# Patient Record
Sex: Female | Born: 1940 | Race: White | Hispanic: No | Marital: Married | Smoking: Never smoker
Health system: Southern US, Community
[De-identification: ages and names within clinical notes are randomized; demographics above are authoritative.]

## PROBLEM LIST (undated history)

## (undated) DIAGNOSIS — K219 Gastro-esophageal reflux disease without esophagitis: Secondary | ICD-10-CM

## (undated) DIAGNOSIS — E039 Hypothyroidism, unspecified: Secondary | ICD-10-CM

## (undated) DIAGNOSIS — I10 Essential (primary) hypertension: Secondary | ICD-10-CM

## (undated) DIAGNOSIS — E7800 Pure hypercholesterolemia, unspecified: Secondary | ICD-10-CM

## (undated) HISTORY — PX: OTHER SURGICAL HISTORY: SHX169

## (undated) HISTORY — DX: Hypothyroidism, unspecified: E03.9

## (undated) HISTORY — DX: Gastro-esophageal reflux disease without esophagitis: K21.9

## (undated) HISTORY — PX: PARTIAL HYSTERECTOMY: SHX80

## (undated) HISTORY — DX: Essential (primary) hypertension: I10

## (undated) HISTORY — DX: Pure hypercholesterolemia, unspecified: E78.00

## (undated) HISTORY — PX: CHOLECYSTECTOMY: SHX55

---

## 2018-04-22 ENCOUNTER — Encounter (INDEPENDENT_AMBULATORY_CARE_PROVIDER_SITE_OTHER): Payer: Self-pay | Admitting: *Deleted

## 2018-04-22 ENCOUNTER — Encounter (INDEPENDENT_AMBULATORY_CARE_PROVIDER_SITE_OTHER): Payer: Self-pay | Admitting: Internal Medicine

## 2018-04-22 ENCOUNTER — Ambulatory Visit (INDEPENDENT_AMBULATORY_CARE_PROVIDER_SITE_OTHER): Payer: Medicare Other | Admitting: Internal Medicine

## 2018-04-22 VITALS — BP 150/80 | HR 56 | Temp 97.3°F | Ht 60.0 in | Wt 151.7 lb

## 2018-04-22 DIAGNOSIS — K219 Gastro-esophageal reflux disease without esophagitis: Secondary | ICD-10-CM | POA: Insufficient documentation

## 2018-04-22 DIAGNOSIS — E7800 Pure hypercholesterolemia, unspecified: Secondary | ICD-10-CM

## 2018-04-22 DIAGNOSIS — R1030 Lower abdominal pain, unspecified: Secondary | ICD-10-CM

## 2018-04-22 DIAGNOSIS — E039 Hypothyroidism, unspecified: Secondary | ICD-10-CM

## 2018-04-22 DIAGNOSIS — R1033 Periumbilical pain: Secondary | ICD-10-CM | POA: Diagnosis not present

## 2018-04-22 DIAGNOSIS — R195 Other fecal abnormalities: Secondary | ICD-10-CM

## 2018-04-22 DIAGNOSIS — I10 Essential (primary) hypertension: Secondary | ICD-10-CM

## 2018-04-22 HISTORY — DX: Pure hypercholesterolemia, unspecified: E78.00

## 2018-04-22 HISTORY — DX: Gastro-esophageal reflux disease without esophagitis: K21.9

## 2018-04-22 HISTORY — DX: Hypothyroidism, unspecified: E03.9

## 2018-04-22 HISTORY — DX: Essential (primary) hypertension: I10

## 2018-04-22 NOTE — Progress Notes (Addendum)
Subjective:    Patient ID: Samantha Morrow, female    DOB: 15-Apr-1941, 77 y.o.   MRN: [id number]  HPI Referred by Dr. Loni Dolly for Irritable bowel syndrome. After she had gallbladder surgery in 2003, she noticed a change in her stools. She would have to go to the bathroom after eating to have a BM.  Symptoms lasted for about a year and a half. In February of this year, she had abdominal pain which radiated into her back. The pain lasted about a week. She saw her PCP a week later and urine was negative. She says now when she has a BM, she has stool shaped like a pebble. Her second stool of the day will be normal.  She says her PCP started her on Miralax and she takes every 3 days. Since starting the Miralx her stools are better.   Stools are formed and are firm. If she has a second stool, it will be softer.  Sometimes she will have lower abdominal pain just before she has a BM.  Her last colonoscopy was in 2012 by Dr. Aleene Davidson and she reports it was normal. She says she is 80% better. She tells me she has a hemorrhoid.  She also says she has a rectocele  She has 2 stools a day.  Appetite is good. No weight loss.   12/14/2010 Colonoscopy: screening: Dr. Aleene Davidson. Normal. Mild internal hemorrhoids, Cecum visualized.Rectosigmoid, descending, transverse, ascending colon and cecum without polypoid disease.   Review of Systems   Past Medical History:  Diagnosis Date  . Essential hypertension 04/22/2018  . GERD (gastroesophageal reflux disease) 04/22/2018  . High cholesterol 04/22/2018  . Hypothyroidism 04/22/2018      Allergies  Allergen Reactions  . Aspirin     Upset stomach  . Erythromycin     ? Throat swelled  . Macrobid [Nitrofurantoin Macrocrystal]     unknown  . Penicillins     Mouth swells, rash  . Quinolones     Unknown     Current Outpatient Medications on File Prior to Visit  Medication Sig Dispense Refill  . Cholecalciferol (VITAMIN D3) 2000 units TABS Take 3,000 Units  by mouth daily.    . cyanocobalamin 1000 MCG tablet Take 1,000 mcg by mouth daily.    Marland Kitchen ezetimibe (ZETIA) 10 MG tablet Take 10 mg by mouth daily.    Marland Kitchen labetalol (NORMODYNE) 200 MG tablet Take 200 mg by mouth 2 (two) times daily.    Marland Kitchen levothyroxine (SYNTHROID, LEVOTHROID) 25 MCG tablet Take 25 mcg by mouth daily before breakfast.    . ranitidine (ZANTAC) 150 MG tablet Take 150 mg by mouth 2 (two) times daily.    Marland Kitchen triamterene-hydrochlorothiazide (MAXZIDE) 75-50 MG tablet Take 1 tablet by mouth daily.     No current facility-administered medications on file prior to visit.         Objective:   Physical Exam Blood pressure (!) 150/80, pulse (!) 56, temperature (!) 97.3 F (36.3 C), height 5' (1.524 m), weight 151 lb 11.2 oz (68.8 kg).  Alert and oriented. Skin warm and dry. Oral mucosa is moist.   . Sclera anicteric, conjunctivae is pink. Thyroid not enlarged. No cervical lymphadenopathy. Lungs clear. Heart regular rate and rhythm.  Abdomen is soft. Bowel sounds are positive. No hepatomegaly. No abdominal masses felt. No tenderness.  No edema to lower extremities. Rectal exam no masses, guaiac negative.  ? Rectocele noted bulging from her vagina.        Assessment &  Plan:  Change in stool/abdominal pain . Stool are better now since starting the Miralax. Will get last colonoscopy report from Dr. Aleene Davidson.  Am going to get an [place] for the abdominal pain.  Further recommendations to follow.

## 2018-04-22 NOTE — Patient Instructions (Addendum)
US abdomen.  Increase fiber in diet.

## 2018-04-30 ENCOUNTER — Ambulatory Visit (HOSPITAL_COMMUNITY)
Admission: RE | Admit: 2018-04-30 | Discharge: 2018-04-30 | Disposition: A | Discharge status: Home / Self Care | Payer: Medicare Other | Source: Ambulatory Visit | Attending: Internal Medicine | Admitting: Internal Medicine

## 2018-04-30 DIAGNOSIS — K760 Fatty (change of) liver, not elsewhere classified: Secondary | ICD-10-CM | POA: Diagnosis not present

## 2018-04-30 DIAGNOSIS — R1033 Periumbilical pain: Secondary | ICD-10-CM | POA: Insufficient documentation

## 2018-04-30 DIAGNOSIS — R93422 Abnormal radiologic findings on diagnostic imaging of left kidney: Secondary | ICD-10-CM | POA: Insufficient documentation

## 2018-04-30 DIAGNOSIS — R1030 Lower abdominal pain, unspecified: Secondary | ICD-10-CM | POA: Diagnosis present

## 2018-04-30 DIAGNOSIS — Z9049 Acquired absence of other specified parts of digestive tract: Secondary | ICD-10-CM | POA: Diagnosis not present

## 2018-05-21 ENCOUNTER — Telehealth (INDEPENDENT_AMBULATORY_CARE_PROVIDER_SITE_OTHER): Payer: Self-pay | Admitting: Internal Medicine

## 2018-05-21 NOTE — Telephone Encounter (Signed)
Please give patient a call at [phone]

## 2018-05-22 NOTE — Telephone Encounter (Signed)
She says she became nauseated and saw a little bit of blood. I advised her to continue her PI. This occurred Tuesday. Described as a very small amt. I advised her to watch her stools to be sure they were not black. If this reoccurs, She should go to the ED.

## 2018-07-31 ENCOUNTER — Emergency Department (HOSPITAL_COMMUNITY)
Admission: EM | Admit: 2018-07-31 | Discharge: 2018-07-31 | Disposition: A | Discharge status: Home / Self Care | Payer: Medicare Other | Attending: Emergency Medicine | Admitting: Emergency Medicine

## 2018-07-31 ENCOUNTER — Emergency Department (HOSPITAL_BASED_OUTPATIENT_CLINIC_OR_DEPARTMENT_OTHER): Payer: Medicare Other

## 2018-07-31 ENCOUNTER — Encounter (HOSPITAL_COMMUNITY): Payer: Self-pay

## 2018-07-31 ENCOUNTER — Other Ambulatory Visit: Payer: Self-pay

## 2018-07-31 DIAGNOSIS — E039 Hypothyroidism, unspecified: Secondary | ICD-10-CM | POA: Diagnosis not present

## 2018-07-31 DIAGNOSIS — M79609 Pain in unspecified limb: Secondary | ICD-10-CM

## 2018-07-31 DIAGNOSIS — M79605 Pain in left leg: Secondary | ICD-10-CM | POA: Insufficient documentation

## 2018-07-31 DIAGNOSIS — I10 Essential (primary) hypertension: Secondary | ICD-10-CM | POA: Insufficient documentation

## 2018-07-31 DIAGNOSIS — I808 Phlebitis and thrombophlebitis of other sites: Secondary | ICD-10-CM | POA: Diagnosis not present

## 2018-07-31 LAB — COMPREHENSIVE METABOLIC PANEL
ALBUMIN: 3.9 g/dL (ref 3.5–5.0)
ALT: 16 U/L (ref 0–44)
AST: 19 U/L (ref 15–41)
Alkaline Phosphatase: 80 U/L (ref 38–126)
Anion gap: 8 (ref 5–15)
BUN: 13 mg/dL (ref 8–23)
CO2: 29 mmol/L (ref 22–32)
Calcium: 9.2 mg/dL (ref 8.9–10.3)
Chloride: 102 mmol/L (ref 98–111)
Creatinine, Ser: 1.08 mg/dL — ABNORMAL HIGH (ref 0.44–1.00)
GFR calc Af Amer: 57 mL/min — ABNORMAL LOW (ref >60)
GFR calc non Af Amer: 49 mL/min — ABNORMAL LOW (ref >60)
Glucose, Bld: 97 mg/dL (ref 70–99)
POTASSIUM: 3.5 mmol/L (ref 3.5–5.1)
SODIUM: 139 mmol/L (ref 135–145)
Total Bilirubin: 1.5 mg/dL — ABNORMAL HIGH (ref 0.3–1.2)
Total Protein: 6.8 g/dL (ref 6.5–8.1)

## 2018-07-31 LAB — CBC WITH DIFFERENTIAL/PLATELET
Abs Immature Granulocytes: 0 10*3/uL (ref 0.0–0.1)
Basophils Absolute: 0.1 10*3/uL (ref 0.0–0.1)
Basophils Relative: 1 %
Eosinophils Absolute: 0.1 10*3/uL (ref 0.0–0.7)
Eosinophils Relative: 1 %
HCT: 45.4 % (ref 36.0–46.0)
HEMOGLOBIN: 14.7 g/dL (ref 12.0–15.0)
Immature Granulocytes: 0 %
LYMPHS PCT: 25 %
Lymphs Abs: 1.8 10*3/uL (ref 0.7–4.0)
MCH: 29.6 pg (ref 26.0–34.0)
MCHC: 32.4 g/dL (ref 30.0–36.0)
MCV: 91.3 fL (ref 78.0–100.0)
MONO ABS: 0.5 10*3/uL (ref 0.1–1.0)
MONOS PCT: 7 %
NEUTROS ABS: 4.7 10*3/uL (ref 1.7–7.7)
Neutrophils Relative %: 66 %
Platelets: 239 10*3/uL (ref 150–400)
RBC: 4.97 MIL/uL (ref 3.87–5.11)
RDW: 12.5 % (ref 11.5–15.5)
WBC: 7.2 10*3/uL (ref 4.0–10.5)

## 2018-07-31 NOTE — ED Notes (Signed)
ED Provider at bedside. 

## 2018-07-31 NOTE — ED Notes (Signed)
Patient Alert and oriented to baseline. Stable and ambulatory to baseline. Patient verbalized understanding of the discharge instructions.  Patient belongings were taken by the patient.   

## 2018-07-31 NOTE — ED Triage Notes (Signed)
Pt endorses stinging and redness to left posterior leg above the knee that is spreading since Sunday. Pt sent here due to possible thrombophlebitis. VSS.

## 2018-07-31 NOTE — Progress Notes (Signed)
Left lower extremity venous duplex completed. There is no evidence of a DVT.Positive for a varicose thrombus coursing from the distal lateral knee into the thigh and across the mid to proximal thigh. There is no evidence of thrombus entering the greater saphenous vein. [place] [employer] Anil Havard, RVS 07/31/2018 6:28 PM

## 2018-07-31 NOTE — ED Provider Notes (Signed)
Emergency Department Provider Note   I have reviewed the triage vital signs and the nursing notes.   HISTORY  Chief Complaint Leg Pain   HPI Samantha Morrow is a 77 y.o. female without significant past medical history the presents to the emergency department today with 2 days of progressively worsening left lateral leg pain.  Patient states that she started having some swelling in redness and palpable hard area in the left lateral area of the popliteal fossa approximately Saturday evening but got significantly worse and Sunday.  She went to see a doctor in [place] who told her superficial thrombophlebitis however progressively worse and is now gone up her lateral leg the posterior thigh it also wraps around to her anterior thigh where there is pain.  She has redness only on a few inches of her left lateral leg into her anterior thigh.  No pain distal.  No recent surgeries or long car rides.  No history of blood clots.  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  Has been doing Tylenol and warm compresses at home which do not seem to be helping. No CP, SOB or syncope. No other associated or modifying symptoms.    Past Medical History:  Diagnosis Date  . Essential hypertension 04/22/2018  . GERD (gastroesophageal reflux disease) 04/22/2018  . High cholesterol 04/22/2018  . Hypothyroidism 04/22/2018    Patient Active Problem List   Diagnosis Date Noted  . Essential hypertension 04/22/2018  . High cholesterol 04/22/2018  . Hypothyroidism 04/22/2018  . GERD (gastroesophageal reflux disease) 04/22/2018    Past Surgical History:  Procedure Laterality Date  . Cataract surgery     2010 (both eyes)  . CHOLECYSTECTOMY     inflamed GB. She did have gallstones  . PARTIAL HYSTERECTOMY     in the 1990s  . Umblical hernia     20 03    Current Outpatient Rx  . Order #: [id number] Class: Historical Med  . Order #: [id number] Class: Historical Med  . Order #: [id number] Class: Historical Med    . Order #: [id number] Class: Historical Med  . Order #: [id number] Class: Historical Med  . Order #: [id number] Class: Historical Med  . Order #: [id number] Class: Historical Med    Allergies Aspirin; Erythromycin; Macrobid [nitrofurantoin macrocrystal]; Penicillins; and Quinolones  History reviewed. No pertinent family history.  Social History Social History   Tobacco Use  . Smoking status: Never Smoker  . Smokeless tobacco: Never Used  Substance Use Topics  . Alcohol use: Never    Frequency: Never  . Drug use: Never    Review of Systems  All other systems negative except as documented in the HPI. All pertinent positives and negatives as reviewed in the HPI. ____________________________________________   PHYSICAL EXAM:  VITAL SIGNS: ED Triage Vitals  Enc Vitals Group     BP 07/31/18 1217 (!) 155/87     Pulse Rate 07/31/18 1217 70     Resp 07/31/18 1217 16     Temp 07/31/18 1217 98 F (36.7 C)     Temp Source 07/31/18 1217 Oral     SpO2 07/31/18 1217 97 %     Weight 07/31/18 1218 148 lb (67.1 kg)     Height 07/31/18 1218 5' (1.524 m)    Constitutional: Alert and oriented. Well appearing and in no acute distress. Eyes: Conjunctivae are normal. PERRL. EOMI. Head: Atraumatic. Nose: No congestion/rhinnorhea. Mouth/Throat: Mucous membranes are moist.  Oropharynx non-erythematous. Neck: No stridor.  No meningeal signs.   Cardiovascular: Normal rate,  regular rhythm. Good peripheral circulation, intact distal pulse in left foot. Grossly normal heart sounds.   Respiratory: Normal respiratory effort.  No retractions. Lungs CTAB. Gastrointestinal: Soft and nontender. No distention.  Musculoskeletal: No lower extremity tenderness nor edema. No gross deformities of extremities. Neurologic:  Normal speech and language. No gross focal neurologic deficits are appreciated.  Skin:  Skin is warm, dry and intact. No rash noted. Palpable cord in left lateral leg with surrounding  erythema, ttp and warmth.  ____________________________________________   LABS (all labs ordered are listed, but only abnormal results are displayed)  Labs Reviewed  COMPREHENSIVE METABOLIC PANEL - Abnormal; Notable for the following components:      Result Value   Creatinine, Ser 1.08 (*)    Total Bilirubin 1.5 (*)    GFR calc non Af Amer 49 (*)    GFR calc Af Amer 57 (*)    All other components within normal limits  CBC WITH DIFFERENTIAL/PLATELET   ____________________________________________    INITIAL IMPRESSION / ASSESSMENT AND PLAN / ED COURSE  Likely superficial thrombophlebitis.  Will evaluate DVT study to make sure nothing more deep.  Otherwise supportive care at home is appropriate.  Superficial thrombophlebitis.  Supportive care as directed by PCP.   Pertinent labs & imaging results that were available during my care of the patient were reviewed by me and considered in my medical decision making (see chart for details).  ____________________________________________  FINAL CLINICAL IMPRESSION(S) / ED DIAGNOSES  Final diagnoses:  Left leg pain  Superficial thrombophlebitis of left upper extremity     MEDICATIONS GIVEN DURING THIS VISIT:  Medications - No data to display   NEW OUTPATIENT MEDICATIONS STARTED DURING THIS VISIT:  Discharge Medication List as of 07/31/2018  6:51 PM      Note:  This note was prepared with assistance of Dragon voice recognition software. Occasional wrong-word or sound-a-like substitutions may have occurred due to the inherent limitations of voice recognition software.   Marce Schartz, Barbara Cower, MD 08/01/18 [phone]

## 2019-03-11 IMAGING — US US ABDOMEN COMPLETE
1 series · 14 of 25 positions shown · non-contrast
Comparison: None.

CLINICAL DATA: Periumbilical pain

EXAM:
ABDOMEN ULTRASOUND COMPLETE

[Series 1: us abdomen complete · 0.15mm/px · 14 of 89 slices shown]
[im 1/89]
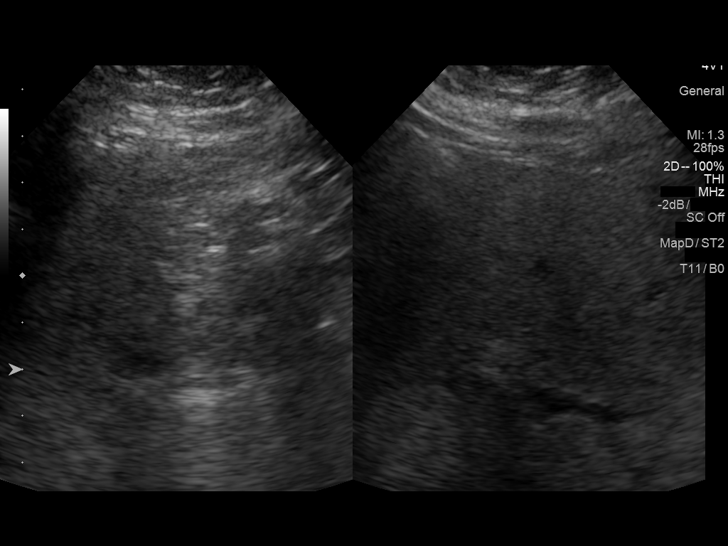
[im 8/89]
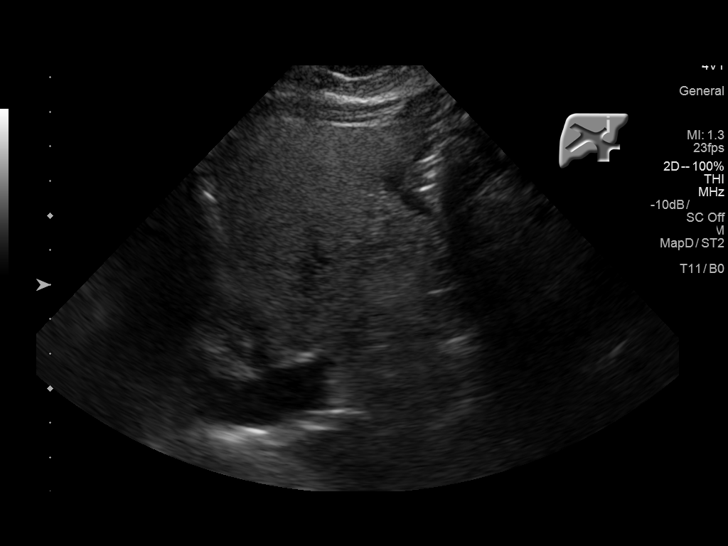
[im 15/89]
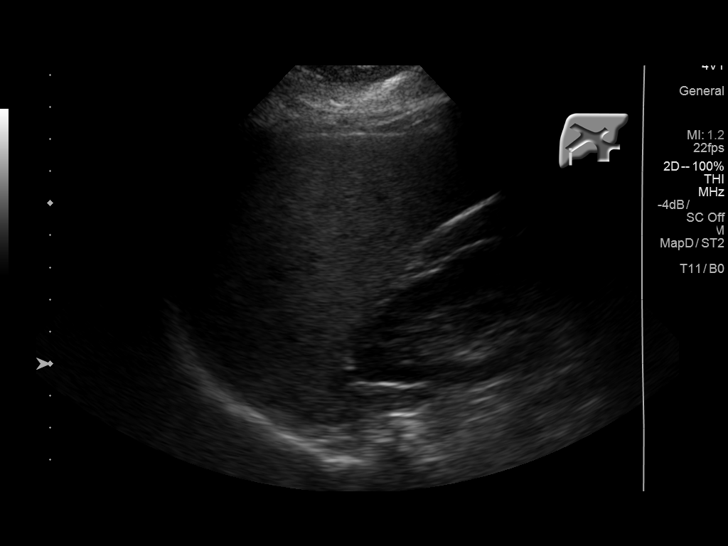
[im 23/89]
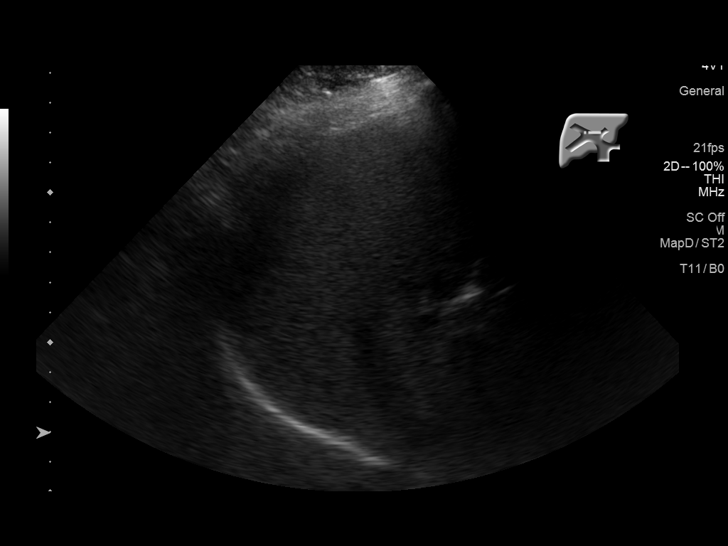
[im 30/89]
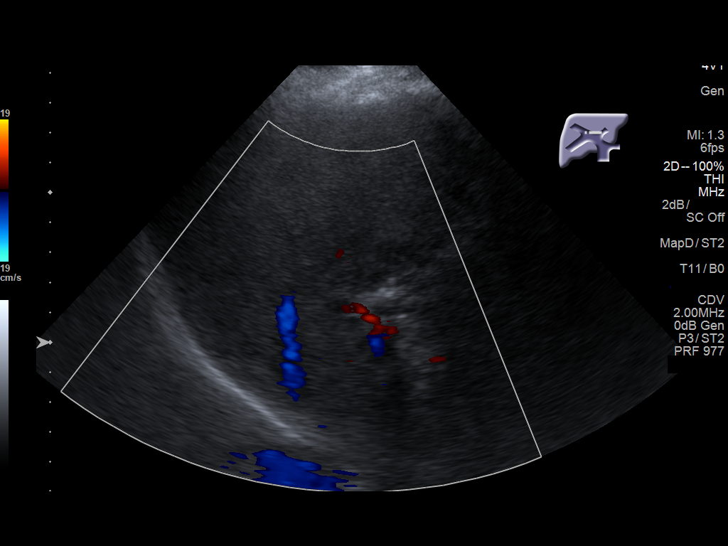
[im 34/89]
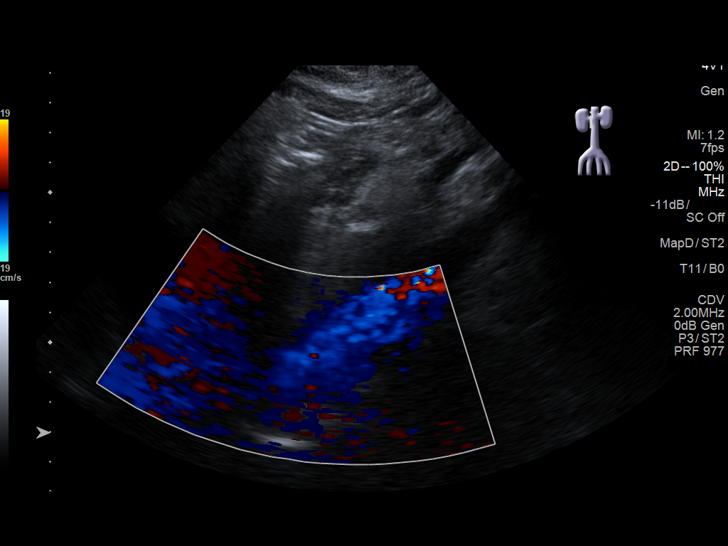
[im 41/89]
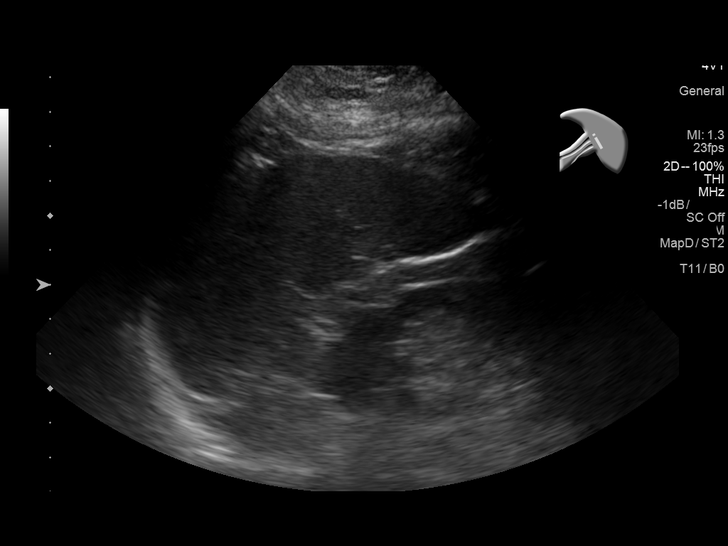
[im 48/89]
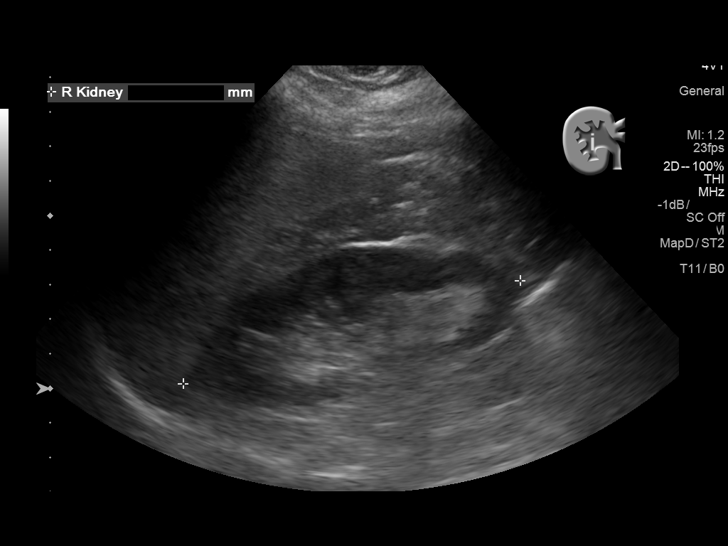
[im 56/89]
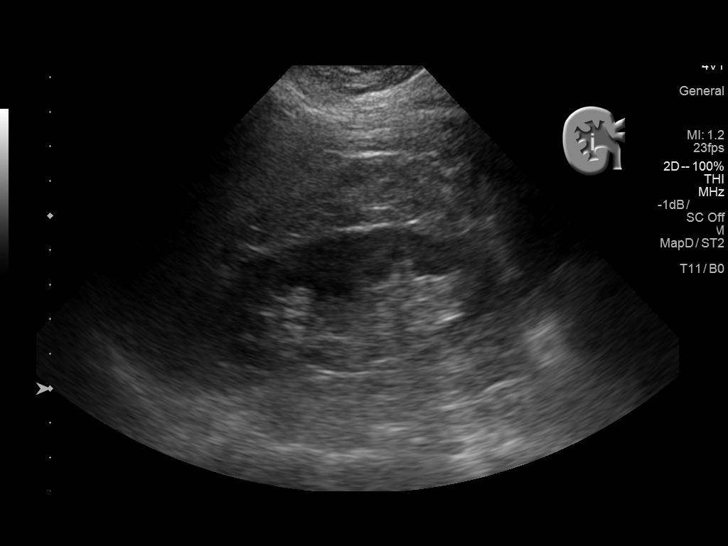
[im 59/89]
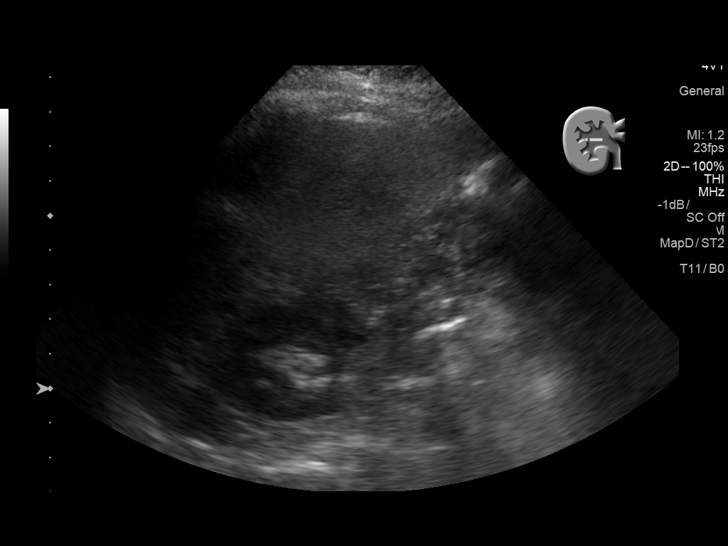
[im 67/89]
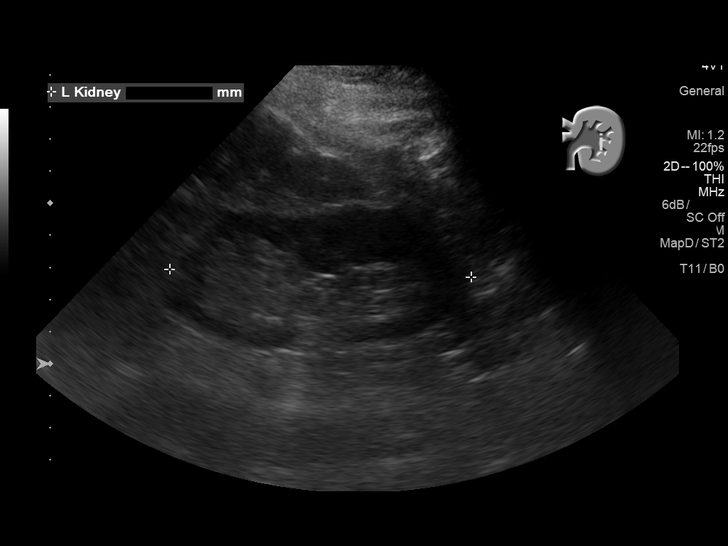
[im 74/89]
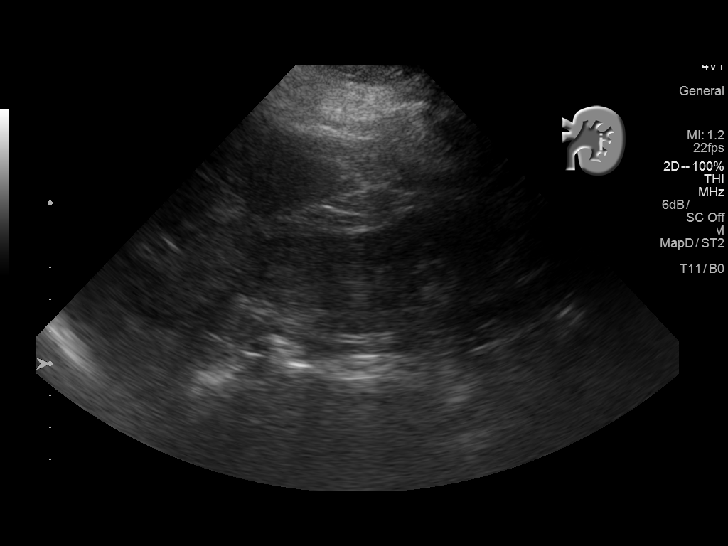
[im 81/89]
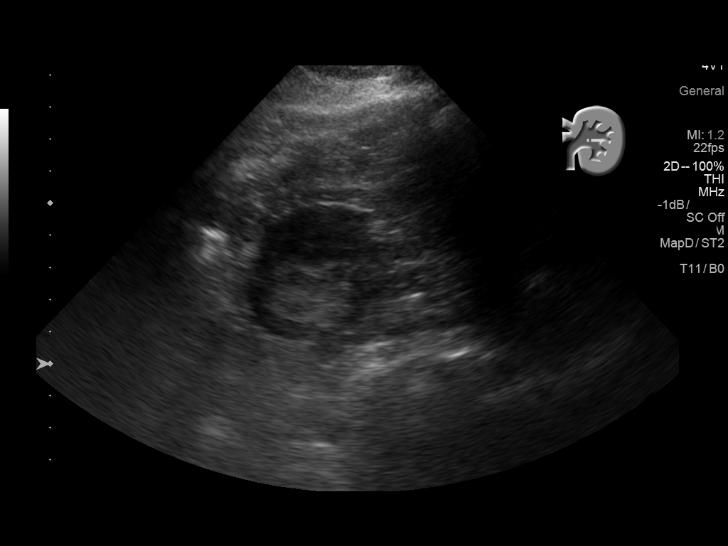
[im 89/89]
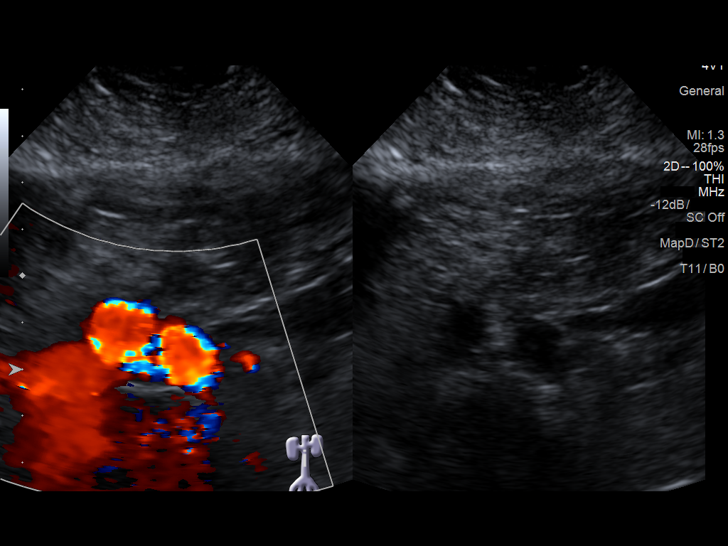

[14 of 25 positions shown; findings below may reference images not displayed]

FINDINGS: Gallbladder: Surgically removed

Common bile duct: Diameter: 2 mm

Liver: Diffusely increased in echogenicity without focal mass.
Portal vein is patent on color Doppler imaging with normal direction
of blood flow towards the liver.

IVC: No abnormality visualized.

Pancreas: Visualized portion unremarkable.

Spleen: Size and appearance within normal limits.

Right Kidney: Length: 10.2 cm.. Echogenicity within normal limits.
No mass or hydronephrosis visualized.

Left Kidney: Length: 9.4 cm.. Some mild cortical thinning is noted
in the upper pole.

Abdominal aorta: No aneurysm visualized.

Other findings: None.
IMPRESSION: Status post cholecystectomy.

Fatty infiltration of the liver.

Focal cortical thinning in the upper pole of the left kidney.
